# Patient Record
Sex: Female | Born: 1935 | Race: Black or African American | Hispanic: No
Health system: Southern US, Community
[De-identification: ages and names within clinical notes are randomized; demographics above are authoritative.]

---

## 2010-06-08 ENCOUNTER — Ambulatory Visit: Payer: Self-pay | Admitting: Internal Medicine

## 2010-06-09 ENCOUNTER — Other Ambulatory Visit: Payer: Self-pay | Admitting: Internal Medicine

## 2010-08-25 ENCOUNTER — Emergency Department: Payer: Self-pay | Admitting: Emergency Medicine

## 2010-09-03 ENCOUNTER — Inpatient Hospital Stay: Payer: Self-pay | Admitting: Specialist

## 2010-09-18 ENCOUNTER — Emergency Department: Payer: Self-pay | Admitting: Emergency Medicine

## 2010-09-20 ENCOUNTER — Ambulatory Visit: Payer: Self-pay | Admitting: Internal Medicine

## 2010-09-29 ENCOUNTER — Ambulatory Visit: Payer: Self-pay | Admitting: Emergency Medicine

## 2010-10-04 LAB — PATHOLOGY REPORT

## 2011-05-17 ENCOUNTER — Ambulatory Visit: Payer: Self-pay | Admitting: Internal Medicine

## 2011-06-14 ENCOUNTER — Emergency Department: Payer: Self-pay | Admitting: Emergency Medicine

## 2011-06-21 ENCOUNTER — Emergency Department: Payer: Self-pay | Admitting: Internal Medicine

## 2011-07-19 ENCOUNTER — Ambulatory Visit: Payer: Self-pay | Admitting: Gastroenterology

## 2011-07-20 LAB — PATHOLOGY REPORT

## 2011-08-08 ENCOUNTER — Ambulatory Visit: Payer: Self-pay | Admitting: Emergency Medicine

## 2011-08-11 ENCOUNTER — Inpatient Hospital Stay: Payer: Self-pay | Admitting: Emergency Medicine

## 2011-08-14 LAB — PATHOLOGY REPORT

## 2013-04-20 ENCOUNTER — Emergency Department: Payer: Self-pay | Admitting: Emergency Medicine

## 2013-04-20 LAB — URINALYSIS, COMPLETE
Bacteria: NONE SEEN
Bilirubin,UR: NEGATIVE
Blood: NEGATIVE
Ketone: NEGATIVE
Nitrite: NEGATIVE
Ph: 5 (ref 4.5–8.0)
Specific Gravity: 1.013 (ref 1.003–1.030)
Squamous Epithelial: 1
WBC UR: 2 /HPF (ref 0–5)

## 2013-04-20 LAB — COMPREHENSIVE METABOLIC PANEL
Albumin: 3.4 g/dL (ref 3.4–5.0)
Alkaline Phosphatase: 126 U/L (ref 50–136)
BUN: 19 mg/dL — ABNORMAL HIGH (ref 7–18)
Bilirubin,Total: 0.1 mg/dL — ABNORMAL LOW (ref 0.2–1.0)
Calcium, Total: 9 mg/dL (ref 8.5–10.1)
Co2: 28 mmol/L (ref 21–32)
Creatinine: 1.23 mg/dL (ref 0.60–1.30)
Glucose: 81 mg/dL (ref 65–99)
Osmolality: 283 (ref 275–301)
Potassium: 4.5 mmol/L (ref 3.5–5.1)
SGOT(AST): 20 U/L (ref 15–37)
SGPT (ALT): 18 U/L (ref 12–78)

## 2013-04-20 LAB — CBC
HCT: 30.1 % — ABNORMAL LOW (ref 35.0–47.0)
MCH: 29.5 pg (ref 26.0–34.0)
MCHC: 32.8 g/dL (ref 32.0–36.0)
MCV: 90 fL (ref 80–100)
Platelet: 270 10*3/uL (ref 150–440)
RBC: 3.35 10*6/uL — ABNORMAL LOW (ref 3.80–5.20)
RDW: 14.5 % (ref 11.5–14.5)

## 2013-04-20 LAB — CK TOTAL AND CKMB (NOT AT ARMC)
CK, Total: 133 U/L (ref 21–215)
CK-MB: 1 ng/mL (ref 0.5–3.6)

## 2013-04-20 LAB — TROPONIN I: Troponin-I: 0.06 ng/mL — ABNORMAL HIGH

## 2013-08-03 ENCOUNTER — Emergency Department: Payer: Self-pay | Admitting: Emergency Medicine

## 2013-08-03 LAB — COMPREHENSIVE METABOLIC PANEL
Anion Gap: 5 — ABNORMAL LOW (ref 7–16)
BUN: 25 mg/dL — ABNORMAL HIGH (ref 7–18)
Bilirubin,Total: 0.1 mg/dL — ABNORMAL LOW (ref 0.2–1.0)
Calcium, Total: 8.7 mg/dL (ref 8.5–10.1)
Creatinine: 1.48 mg/dL — ABNORMAL HIGH (ref 0.60–1.30)
EGFR (African American): 39 — ABNORMAL LOW
EGFR (Non-African Amer.): 34 — ABNORMAL LOW
Potassium: 4.3 mmol/L (ref 3.5–5.1)
SGPT (ALT): 26 U/L (ref 12–78)
Sodium: 139 mmol/L (ref 136–145)

## 2013-08-03 LAB — DRUG SCREEN, URINE
Barbiturates, Ur Screen: NEGATIVE (ref ?–200)
Benzodiazepine, Ur Scrn: NEGATIVE (ref ?–200)
Cannabinoid 50 Ng, Ur ~~LOC~~: NEGATIVE (ref ?–50)
MDMA (Ecstasy)Ur Screen: NEGATIVE (ref ?–500)
Opiate, Ur Screen: NEGATIVE (ref ?–300)
Phencyclidine (PCP) Ur S: NEGATIVE (ref ?–25)

## 2013-08-03 LAB — CBC
HCT: 32.3 % — ABNORMAL LOW (ref 35.0–47.0)
MCH: 32.7 pg (ref 26.0–34.0)
MCHC: 35.5 g/dL (ref 32.0–36.0)
Platelet: 253 10*3/uL (ref 150–440)
RDW: 14.2 % (ref 11.5–14.5)
WBC: 9.1 10*3/uL (ref 3.6–11.0)

## 2013-08-03 LAB — ETHANOL: Ethanol: <3 mg/dL

## 2013-08-03 LAB — URINALYSIS, COMPLETE
Bacteria: NONE SEEN
Bilirubin,UR: NEGATIVE
Blood: NEGATIVE
Glucose,UR: NEGATIVE mg/dL (ref 0–75)
Nitrite: NEGATIVE
Ph: 5 (ref 4.5–8.0)
Specific Gravity: 1.014 (ref 1.003–1.030)
Squamous Epithelial: 1
WBC UR: 17 /HPF (ref 0–5)

## 2013-08-03 LAB — ACETAMINOPHEN LEVEL: Acetaminophen: <2 ug/mL

## 2013-08-03 LAB — SALICYLATE LEVEL: Salicylates, Serum: <1.7 mg/dL

## 2013-12-17 ENCOUNTER — Emergency Department: Payer: Self-pay | Admitting: Emergency Medicine

## 2014-10-31 ENCOUNTER — Ambulatory Visit: Payer: Self-pay

## 2014-10-31 LAB — URINALYSIS, COMPLETE
BACTERIA: NONE SEEN
Bilirubin,UR: NEGATIVE
Blood: NEGATIVE
GLUCOSE, UR: NEGATIVE mg/dL (ref 0–75)
Ketone: NEGATIVE
Leukocyte Esterase: NEGATIVE
Nitrite: NEGATIVE
PH: 5 (ref 4.5–8.0)
Protein: NEGATIVE
RBC,UR: <1 /HPF (ref 0–5)
SQUAMOUS EPITHELIAL: NONE SEEN
Specific Gravity: 1.016 (ref 1.003–1.030)
WBC UR: 1 /HPF (ref 0–5)

## 2014-11-02 LAB — URINE CULTURE

## 2014-12-17 ENCOUNTER — Inpatient Hospital Stay: Payer: Self-pay | Admitting: Internal Medicine

## 2014-12-17 LAB — CBC WITH DIFFERENTIAL/PLATELET
BASOS PCT: 0.8 %
Basophil #: 0.1 10*3/uL (ref 0.0–0.1)
EOS PCT: 1.2 %
Eosinophil #: 0.1 10*3/uL (ref 0.0–0.7)
HCT: 38 % (ref 35.0–47.0)
HGB: 11.8 g/dL — AB (ref 12.0–16.0)
LYMPHS ABS: 3.1 10*3/uL (ref 1.0–3.6)
Lymphocyte %: 26.1 %
MCH: 30.7 pg (ref 26.0–34.0)
MCHC: 31 g/dL — ABNORMAL LOW (ref 32.0–36.0)
MCV: 99 fL (ref 80–100)
MONO ABS: 0.8 x10 3/mm (ref 0.2–0.9)
Monocyte %: 6.6 %
NEUTROS ABS: 7.8 10*3/uL — AB (ref 1.4–6.5)
NEUTROS PCT: 65.3 %
Platelet: 373 10*3/uL (ref 150–440)
RBC: 3.84 10*6/uL (ref 3.80–5.20)
RDW: 14.5 % (ref 11.5–14.5)
WBC: 12 10*3/uL — ABNORMAL HIGH (ref 3.6–11.0)

## 2014-12-17 LAB — URINALYSIS, COMPLETE
Bilirubin,UR: NEGATIVE
Glucose,UR: NEGATIVE mg/dL (ref 0–75)
KETONE: NEGATIVE
NITRITE: NEGATIVE
Ph: 5 (ref 4.5–8.0)
Protein: NEGATIVE
RBC,UR: 15 /HPF (ref 0–5)
SPECIFIC GRAVITY: 1.017 (ref 1.003–1.030)
WBC UR: 57 /HPF (ref 0–5)

## 2014-12-17 LAB — PROTIME-INR
INR: 1.1
Prothrombin Time: 14.3 secs (ref 11.5–14.7)

## 2014-12-17 LAB — COMPREHENSIVE METABOLIC PANEL
ALBUMIN: 3.2 g/dL — AB (ref 3.4–5.0)
Alkaline Phosphatase: 101 U/L
Anion Gap: 13 (ref 7–16)
BUN: 69 mg/dL — AB (ref 7–18)
Bilirubin,Total: 0.2 mg/dL (ref 0.2–1.0)
Calcium, Total: 9.5 mg/dL (ref 8.5–10.1)
Chloride: 112 mmol/L — ABNORMAL HIGH (ref 98–107)
Co2: 20 mmol/L — ABNORMAL LOW (ref 21–32)
Creatinine: 2.65 mg/dL — ABNORMAL HIGH (ref 0.60–1.30)
GFR CALC AF AMER: 22 — AB
GFR CALC NON AF AMER: 19 — AB
GLUCOSE: 107 mg/dL — AB (ref 65–99)
Osmolality: 309 (ref 275–301)
Potassium: 6 mmol/L — ABNORMAL HIGH (ref 3.5–5.1)
SGOT(AST): 45 U/L — ABNORMAL HIGH (ref 15–37)
SGPT (ALT): 43 U/L
Sodium: 145 mmol/L (ref 136–145)
TOTAL PROTEIN: 8.4 g/dL — AB (ref 6.4–8.2)

## 2014-12-17 LAB — TROPONIN I: Troponin-I: 0.05 ng/mL

## 2014-12-17 LAB — LIPASE, BLOOD: Lipase: 324 U/L (ref 73–393)

## 2014-12-17 LAB — APTT: Activated PTT: 28.1 secs (ref 23.6–35.9)

## 2014-12-17 LAB — POTASSIUM: Potassium: 4.9 mmol/L (ref 3.5–5.1)

## 2014-12-18 LAB — CBC WITH DIFFERENTIAL/PLATELET
BASOS ABS: 0.1 10*3/uL (ref 0.0–0.1)
Basophil %: 0.7 %
EOS PCT: 1.8 %
Eosinophil #: 0.2 10*3/uL (ref 0.0–0.7)
HCT: 31.9 % — ABNORMAL LOW (ref 35.0–47.0)
HGB: 10.2 g/dL — ABNORMAL LOW (ref 12.0–16.0)
Lymphocyte #: 3.3 10*3/uL (ref 1.0–3.6)
Lymphocyte %: 32.1 %
MCH: 30.9 pg (ref 26.0–34.0)
MCHC: 32 g/dL (ref 32.0–36.0)
MCV: 97 fL (ref 80–100)
MONO ABS: 0.6 x10 3/mm (ref 0.2–0.9)
Monocyte %: 5.9 %
Neutrophil #: 6.1 10*3/uL (ref 1.4–6.5)
Neutrophil %: 59.5 %
Platelet: 313 10*3/uL (ref 150–440)
RBC: 3.3 10*6/uL — AB (ref 3.80–5.20)
RDW: 13.9 % (ref 11.5–14.5)
WBC: 10.2 10*3/uL (ref 3.6–11.0)

## 2014-12-18 LAB — BASIC METABOLIC PANEL
Anion Gap: 7 (ref 7–16)
BUN: 55 mg/dL — AB (ref 7–18)
CHLORIDE: 116 mmol/L — AB (ref 98–107)
CREATININE: 1.96 mg/dL — AB (ref 0.60–1.30)
Calcium, Total: 9.4 mg/dL (ref 8.5–10.1)
Co2: 23 mmol/L (ref 21–32)
EGFR (African American): 32 — ABNORMAL LOW
GFR CALC NON AF AMER: 26 — AB
GLUCOSE: 107 mg/dL — AB (ref 65–99)
OSMOLALITY: 306 (ref 275–301)
POTASSIUM: 5 mmol/L (ref 3.5–5.1)
Sodium: 146 mmol/L — ABNORMAL HIGH (ref 136–145)

## 2014-12-19 LAB — BASIC METABOLIC PANEL
Anion Gap: 7 (ref 7–16)
BUN: 29 mg/dL — AB (ref 7–18)
CHLORIDE: 111 mmol/L — AB (ref 98–107)
CO2: 22 mmol/L (ref 21–32)
Calcium, Total: 8.8 mg/dL (ref 8.5–10.1)
Creatinine: 1.39 mg/dL — ABNORMAL HIGH (ref 0.60–1.30)
EGFR (African American): 47 — ABNORMAL LOW
EGFR (Non-African Amer.): 39 — ABNORMAL LOW
Glucose: 85 mg/dL (ref 65–99)
Osmolality: 284 (ref 275–301)
Potassium: 4.9 mmol/L (ref 3.5–5.1)
SODIUM: 140 mmol/L (ref 136–145)

## 2014-12-19 LAB — CLOSTRIDIUM DIFFICILE(ARMC)

## 2014-12-20 LAB — URINE CULTURE

## 2014-12-21 LAB — STOOL CULTURE

## 2014-12-28 ENCOUNTER — Ambulatory Visit: Payer: Self-pay | Admitting: Internal Medicine

## 2015-01-10 ENCOUNTER — Inpatient Hospital Stay: Payer: Self-pay | Admitting: Internal Medicine

## 2015-01-26 ENCOUNTER — Ambulatory Visit
Admit: 2015-01-26 | Disposition: A | Discharge status: Home / Self Care | Payer: Self-pay | Attending: Internal Medicine | Admitting: Internal Medicine

## 2015-02-26 DEATH — deceased

## 2015-03-28 NOTE — Consult Note (Signed)
Chief Complaint:  Subjective/Chief Complaint seen for diarrhea and abdominalpain.  Patient more alert today, more responsive though tangential.   denies nausea or abdominal pain.   VITAL SIGNS/ANCILLARY NOTES: **Vital Signs.:   18-Feb-16 16:37  Vital Signs Type Q 4hr  Temperature Temperature (F) 98.9  Celsius 37.1  Respirations Respirations 18  Systolic BP Systolic BP 001  Diastolic BP (mmHg) Diastolic BP (mmHg) 97  Mean BP 110  Pulse Ox % Pulse Ox % 98  Pulse Ox Activity Level  At rest  Oxygen Delivery Room Air/ 21 %   Brief Assessment:  Cardiac Regular   Respiratory clear BS   Gastrointestinal details normal Nondistended  No rebound tenderness  bowel sounds positive, no apparent tenderness to palpation.  however mild voluntary guarding?   Lab Results:  Routine Micro:  17-Feb-16 19:55   Culture Comment    . POSITIVE CAMPYLOBACTER AG (JEJUNI/COLI)  Routine Chem:  17-Feb-16 19:55   Result Comment CAMPYLOBACTER ANTIGEN - READ-BACK PROCESS PERFORMED.  - ABBIE GOODMAN 01/14/15 @ 1025.Marland KitchenMarland KitchenMTV  Result(s) reported on 14 Jan 2015 at 10:31AM.  18-Feb-16 10:19   Glucose, Serum  137  BUN  47  Creatinine (comp)  2.85  Sodium, Serum 138  Potassium, Serum  3.2  Chloride, Serum 102  CO2, Serum 27  Calcium (Total), Serum  8.4  Anion Gap 9  Osmolality (calc) 290  eGFR (African American)  21  eGFR (Non-African American)  17 (eGFR values <68m/min/1.73 m2 may be an indication of chronic kidney disease (CKD). Calculated eGFR, using the MRDR Study equation, is useful in  patients with stable renal function. The eGFR calculation will not be reliable in acutely ill patients when serum creatinine is changing rapidly. It is not useful in patients on dialysis. The eGFR calculation may not be applicable to patients at the low and high extremes of body sizes, pregnant women, and vegetarians.)   Radiology Results: XRay:    17-Feb-16 17:57, Abdomen Flat and Erect  Abdomen Flat and Erect    REASON FOR EXAM:    evidence of possible sbo v. enteritis  COMMENTS:       PROCEDURE: DXR - DXR ABDOMEN 2 V FLAT AND ERECT  - Jan 13 2015  5:57PM     CLINICAL DATA:  Abnormal CT demonstrating small bowel distention  question enteritis versus small bowel obstruction, followup    EXAM:  ABDOMEN - 2 VIEW    COMPARISON:  CT abdomen pelvis 01/10/2015    FINDINGS:  Distended loops of small bowel are seen in the mid abdomen and  pelvis.    Persistent visualization of gas within the stomach though the  stomach appears less distended than on the prior CT.    No definite bowel wall thickening or free intraperitoneal air.    Surgical clips RIGHT upper quadrant from prior cholecystectomy.    No significant colonic gas.    Probable rectal tube.    Extensive atherosclerotic calcifications.  Osseous demineralization with scoliosis and degenerative changes of  the lumbar spine.    Numerous calcified granulomata at lower LEFT lung.    Lung bases otherwise grossly clear.     IMPRESSION:  Persistent dilatation of small bowel loops with paucity of colonic  gas compatible with small bowel obstruction.    No definite bowel wall thickening or free intraperitoneal air.    Extensive atherosclerotic disease changes.  Electronically Signed    By: MLavonia DanaM.D.    On: 01/13/2015 18:11  Verified By: Burnetta Sabin, M.D.,   Assessment/Plan:  Assessment/Plan:  Assessment 1) diarrhea, abdominal pain-imaging showing possible enteritis/SBO/ileus.  Culture today showing positive for campylobacter.  Patient currently on zosyn, vancomycin and previously on augemntin for admission dx of sepsis.   Campylobacter is resistant/not responsive to beta-lactams or pcn, and likely not vancomycin.  recommend starting macrolide, azithromycin, some recommendations are for several weeks of treatment. Daily abdominal film.   Plan as above.   Electronic Signatures: Loistine Simas (MD)  (Signed  18-Feb-16 17:43)  Authored: Chief Complaint, VITAL SIGNS/ANCILLARY NOTES, Brief Assessment, Lab Results, Radiology Results, Assessment/Plan   Last Updated: 18-Feb-16 17:43 by Loistine Simas (MD)

## 2015-03-28 NOTE — Consult Note (Signed)
Brief Consult Note: Diagnosis: sepsis.   Patient was seen by consultant.   Consult note dictated.   Comments: Appreciate consult to 81 y/o african [place] woman with history of severe dementia (resides [facility]), CRF, htn, aocd, recent UTI, admitted with sepsis, ARF, UTI/enteritis, for evaluation of abdominal pain and diarrhea. On admission patient was found to be c-diff negative. Had CT demonstrating some ileitis and enteritis to small bowel. Urine had enterobacter growing. Was on amoxicillin prior to admission - on adm was treated with vancomycin, zosyn, and IVF. Currently on Augmentin. Has been evaluated by nephrology and palliative care- pt not hospice approp now. Patient unable to give history due to severe dementia. No family at bedside. Hx gathered from chart and speaking to nurse. Has had diarrhea through hospital course- stools watery green : no black or bloody stools. Has had some possible abdominal pain. Appetite decreased- has had speech evaluation and is on pureed diet. No other abdominal issues. Labs: ANA/ANCA normal, no m-spike on SPEP. K+ normal. Albumin low at 3.0. WBC 18, hgb 8.7- this is improved from the last couple of checks. Platelets normal.  Cspy 2012 by Dr Niel HummerIftikhar with 20mm polyup removed. EGD 2012 gastritis/normal duodenum. EGD 2011 found duodenal ulcers. On exam, abdomen protuberant, soft with some RLQ tenderness. Has rectal tube in place with green watery diarrhea. Impression/plan: Diarrhea. Intermittent abdominal pain. Recent findings of enteritis/ileitis v. partial sbo on CT scan. There has been no NV. Has had multiple antibiotics, and is currently on Augmentin. Repeat c-diff; 2 view abdomen. Further recommendations to follow.  Electronic Signatures: Vevelyn PatLondon, Natalyn Szymanowski H (NP)  (Signed 17-Feb-16 16:07)  Authored: Brief Consult Note   Last Updated: 17-Feb-16 16:07 by Keturah BarreLondon, Caya Soberanis H (NP)

## 2015-03-28 NOTE — H&P (Signed)
PATIENT NAME:  Melissa Roberts, Melissa Roberts MR#:  [id number] DATE OF BIRTH:  07-25-1934  DATE OF ADMISSION:  01/10/2015  PRIMARY CARE PHYSICIAN: Corky Downs, MD   CHIEF COMPLAINT: Abdominal pain and hypotension.   HISTORY OF PRESENT ILLNESS: This is a 81 year old female, who was brought in from  [facility] skilled nursing facility due to abdominal pain and hypotension. The patient was recently hospitalized for similar complaints about 2 weeks ago and diagnosed with some enteritis and urinary tract infection, and discharged on oral amoxicillin. She now returns due to similar symptoms of vague abdominal pain, having some diarrhea and poor p.o. intake. She was noted to be severely hypothermic when she arrived to the ER with a temperature of 89.6. She was also noted to be hypotensive and the systolic blood pressures in the 70s, also bradycardic. Her blood work was consistent with severe acute renal failure. She was diagnosed with septic shock secondary to a possible enteritis. Hospitalist services were contacted for further treatment and evaluation. Given the patient's advanced dementia, most of the history obtained from the family and also from the chart and ER physician.   REVIEW OF SYSTEMS: Otherwise unobtainable.   PAST MEDICAL HISTORY: Consistent with severe Alzheimer dementia, hypertension, chronic anemia, chronic kidney disease stage III, history of diarrhea.   ALLERGIES: CODEINE.   SOCIAL HISTORY: No smoking or alcohol abuse. No illicit drug abuse. Resides at [facility].   FAMILY HISTORY: Mother and father both died from old age.   CURRENT MEDICATIONS: As follows: Tylenol 650 every 4 hours as needed, Aricept 5 mg daily, Desitin 13% topical to be applied to the buttocks, Drisdol 50,000 international units monthly, loperamide 2 mg daily as needed, Proctosol HC 2.5% topical cream to the hemorrhoids as needed.   PHYSICAL EXAMINATION: Presently is as follows:  VITAL SIGNS: Temperature is 89.2,  pulse 70, respirations are 14, blood pressure is 90/60, sats 100% on room air.  GENERAL: She is a critically ill, lethargic-appearing female, but in no apparent distress.  HEAD, EYES, EARS, NOSE, THROAT: Atraumatic, normocephalic. Extraocular muscles are intact. Pupils equal and reactive to light. Sclerae anicteric. No conjunctival injection. No pharyngeal erythema. Dry oral mucosa.  NECK: Supple. There is no jugular venous distention. No bruits. No lymphadenopathy or thyromegaly.  HEART: Regular rate and rhythm. No murmurs, no rubs, no clicks.  LUNGS: Clear to auscultation anteriorly. No rales or rhonchi. No wheezes.  ABDOMEN: Soft, flat, nontender, nondistended. Has good bowel sounds. No hepatosplenomegaly appreciated.  EXTREMITIES: No evidence of any cyanosis, clubbing, or peripheral edema. Has +2 pedal and radial pulses bilaterally.  NEUROLOGIC: The patient is quite encephalopathic. Globally weak, difficult to do a full neurologic exam. Moves all extremities spontaneously.  SKIN: Moist and warm with no rashes.  LYMPHATIC: There is no cervical or axillary lymphadenopathy.   LABORATORY EXAMINATION: She had a serum glucose of 198, BUN 110, creatinine 8.2, sodium 147, potassium 4.4, chloride 115, bicarbonate 8, anion gap at 24. LFTs are mildly elevated with an AST of 38, ALT 70, albumin 3.0, troponin 0.05. White cell count 17.8, hemoglobin 11.9, hematocrit 39.1, platelet count 37, INR is 1.3, lactic acid of 11.5. The patient did have a chest x-ray done, which showed no acute cardiopulmonary process. The patient also had a CT scan of the abdomen and pelvis done, showing mildly distended loops of small bowel and stomach with gas and fluid with relative area of narrowing at the terminal ileum. No obstructing cause identified. Questionable partial bowel obstruction versus  ileus enteritis. No evidence of pneumoperitoneum. The patient also had a CT head done, which showed no acute intracranial process.    ASSESSMENT AND PLAN: This is a 81 year old female with a history of advanced dementia, chronic anemia, history of diarrhea, hypertension, chronic kidney disease stage III, who presents to the hospital due to abdominal pain, hypotension and noted to be in acute renal failure with septic shock.   1. Septic shock. This is likely secondary to enteritis. The patient's CT shows ileus/enteritis. The patient was also apparently having some diarrhea prior to coming in. Her chest x-ray is negative for pneumonia. Her urinalysis is still pending. Will continue aggressive IV fluids, give her broad-spectrum intravenous antibiotics, vancomycin and Zosyn check stool for Clostridium difficile and comprehensive culture. Follow sputum and blood cultures and urine cultures. Give her dopamine, keep mean arterial pressure greater than 60.  2. Altered mental status. This is likely metabolic encephalopathy from the septic shock. The patient's CT head is negative for any acute intracranial abnormality. Follow mental status after treatment for sepsis and underlying infection.  3. Acute renal failure. This is likely acute tubular necrosis from severe hypotension, sepsis and also severe dehydration. Continue aggressive intravenous fluid hydration. Continue vasopressors. Keep MAP greater than 60. Follow BUN and creatinine and urine output, renal dose medications, avoid nephrotoxins. We will get a nephrology consult. There is no urgent need for hemodialysis and the patient's family does not want hemodialysis at this time.  4. Leukocytosis. This is likely secondary to the sepsis. We will follow white cell count with IV antibiotic therapy.  5. Lactic acidosis. This is likely secondary to septic shock and dehydration. We will continue supportive care with intravenous fluids, intravenous antibiotics and vasopressors and follow clinically.   The patient is high risk for cardiorespiratory arrest. The patient is already DO NOT RESUSCITATE.  We will get a palliative care consult to discuss goals of care with the family.   CRITICAL CARE TIME SPENT: 50 minutes.   ____________________________ Rolly PancakeVivek J. Cherlynn KaiserSainani, MD vjs:ap D: 01/10/2015 15:01:26 ET T: 01/10/2015 15:46:37 ET JOB#: [id number]  cc: Rolly PancakeVivek J. Cherlynn KaiserSainani, MD, <Dictator> Houston SirenVIVEK J SAINANI MD ELECTRONICALLY SIGNED 01/27/2015 15:40

## 2015-03-28 NOTE — Consult Note (Signed)
   Comments   Goals of care meeting had with pt's two daughters, Lucendia HerrlichFaye and Dewayne Hatchnn.  Family states Hospice services were mentioned in the ER and this is how Palliative Medicine was consulted.  [facility]  Pt at baseline can walk on her own, eats 50-75% of meals, can participate in feeding self and cannot bathe or dress herself.  Pt is incontinent of bladder and bowel.  Family states they do not think pt is hospice appropriate at this time and I agree.  Family would like to continue current treatment with the hopes of pt recovering from her infection.  Family agrees that pt will be hospice appropriate one day due to her Dementia diagnosis and would accept services at that time.  [facility]  Electronic Signatures: Aslyn Cottman, Jaquelyn BitterStephen J (NP)  (Signed 15-Feb-16 12:34)  Authored: Palliative Care Phifer, Harriett SineNancy (MD)  (Signed 15-Feb-16 15:29)  Authored: Palliative Care   Last Updated: 15-Feb-16 15:29 by Phifer, Harriett SineNancy (MD)

## 2015-03-28 NOTE — Consult Note (Signed)
PATIENT NAME:  Melissa Roberts, Melissa Roberts MR#:  [id number] DATE OF BIRTH:  Jun 16, 1934  DATE OF CONSULTATION:  01/13/2015  REFERRING PHYSICIAN:   CONSULTING PHYSICIAN:  Keturah Barrehristiane H. Wei Newbrough, NP  REASON FOR CONSULTATION: GI consult ordered by Dr. Allena KatzPatel for evaluation of diarrhea and abdominal pain.   HISTORY OF PRESENT ILLNESS: I appreciate consult for this 81 year old [place] woman with a history of severe dementia who [facility] [place], hypertension, anemia of chronic disease, recent UTI, admitted with sepsis, ARF, URI/enteritis for evaluation of abdominal pain and diarrhea. On admission, the patient was found to be Clostridium difficile negative. Head CT demonstrated some ileitis and enteritis versus partial small bowel obstruction. Urine had Enterobacter growing. Was on amoxicillin prior to admission. On admission was treated with vancomycin, Zosyn, and IV fluids, currently Augmentin. Has been evaluated by nephrology and palliative care. The patient hospice appropriate now. The patient is unable to give history due to severe dementia. No family at bedside. History is gathered from chart and speaking to nurse. Has had diarrhea throughout the hospital course. Stools have been watery, green. No black or bloody stools, has had some possible abdominal pain. Appetite has been decreased. Had speech evaluation, is on pureed diet now at this time. There have been no other abdominal issues. Laboratory studies include a normal ANA/ANCA. She had an SPEP without any end spike. Potassium normal. Albumin low at 3.0. WBC 18, hemoglobin 8.7, this has improved from the last couple of checks. Her platelets are normal. Colonoscopy 2012 by Dr. Darcel BayleyIftikha with 20 mm polyp removed. EGD 2012 with gastritis and normal duodenum. EGD 2011 found duodenal ulcers.   REVIEW OF SYSTEMS: Unobtainable due to the patient's mental status.   PAST MEDICAL HISTORY: Severe Alzheimer, hypertension, chronic anemia, chronic kidney  disease stage III, history of diarrhea.   ALLERGIES: CODEINE.   SOCIAL HISTORY: No alcohol or illicits. [facility]   FAMILY HISTORY: Unknown.   CURRENT MEDICATIONS: Acetaminophen 325 tablets, 2 tablets every 4 hours p.r.n., Zyrtec 5 mg once a day, Desitin cream, Drisdol vitamin D once a week 50,000 units, Imodium 1 tablet once a day p.r.n., Proctosol topical cream daily x 14 days.   MOST RECENT LABORATORIES: Glucose 114, BUN 56, creatinine 3.06, sodium 144, potassium 3.7, chloride 109, GFR about 16, calcium 7.9, lipase 165, magnesium 2.2, total protein 8, albumin 3, total bilirubin 0.2, ALP 130, AST 38, ALT 70. WBC 18, hemoglobin 8.7, hematocrit 27.2, platelet count 204. Blood cultures negative. Urine culture demonstrating enterococcus. Clostridium difficile test negative  02/14. CT scan 02/14 with ileitis/enteritis versus partial small bowel obstruction. Gas and fluid in the colon. No definite obstruction. ANCA negative. ANA negative. No Roberts-spike on SPEP.    PHYSICAL EXAMINATION: MOST RECENT VITAL SIGNS: Temperature 98.5, pulse 80, respiratory rate 18, blood pressure 116/79, SaO2 99% on room air.  GENERAL: A well-appearing, elderly lady in bed in no acute distress.  PSYCHIATRIC: Limited means of communication. Responds to some questions with limited statements. Otherwise, does not answer some, follows some commands.  HEENT: Normocephalic, atraumatic. Conjunctivae pink. Sclerae are clear.  NECK: Supple. No JVD or lymphadenopathy.  CHEST: Respirations unlabored, eupneic.  LUNGS: With minimally decreased bilateral bases.  ABDOMEN: Protuberant abdomen. Hypoactive bowel sounds x 4. There is some right lower quadrant tenderness. No guarding, rigidity, peritoneal signs, hepatosplenomegaly or other masses. She does have a rectal tube draining green watery diarrhea.  SKIN: Warm, dry, pink. No erythema, lesion or rash.  EXTREMITIES: Strength  4/5. Has some general weakness. No clubbing or  cyanosis.  NEUROLOGIC: Alert, difficult to assess orientation. Cranial nerves appear to be intact.   IMPRESSION AND PLAN: Diarrhea and intermittent abdominal pain. Recent findings of enteritis/ileitis versus partial small bowel obstruction on CT scan. There has been no nausea and vomiting. Has had multiple antibiotics and is currently on Augmentin. Repeat Clostridium difficile, 2 view abdomen. Further recommendations to follow. Thank you very much for this consult.   These services were provided by Vevelyn Pat, MSN, [facility], in collaboration with Barnetta Chapel, MD, with whom I have discussed this patient in full.     ____________________________ Keturah Barre, NP chl:at D: 01/14/2015 09:04:57 ET T: 01/14/2015 09:19:55 ET JOB#: [id number]  cc: Keturah Barre, NP, <Dictator> Eustaquio Maize Ethaniel Garfield FNP ELECTRONICALLY SIGNED 01/14/2015 17:06

## 2015-03-28 NOTE — Consult Note (Signed)
Chief Complaint:  Subjective/Chief Complaint Please see full GI consult and brief consult note.   Patietn seen and examined.  Patient admitted with sepsis.  Currently with continued diarrhea and abdominal pain.  On examination patient relates pain when asked, but minimal discomfort to palpation, no evidence of acute abdomen, non-distended,  Increasing wbc noted today.  No stool cultures done on admission, will obtain now, although may not be informative due to interval abx.  Abd plain films to follow possible ileus.  Following.   Electronic Signatures: Barnetta ChapelSkulskie, Martin (MD)  (Signed 17-Feb-16 18:55)  Authored: Chief Complaint   Last Updated: 17-Feb-16 18:55 by Barnetta ChapelSkulskie, Martin (MD)

## 2015-03-28 NOTE — Consult Note (Signed)
PATIENT NAME:  Melissa Roberts, Melissa Roberts MR#:  [id number] DATE OF BIRTH:  1934/07/15  DATE OF CONSULTATION:  01/11/2015  REQUESTING PHYSICIAN:   Abel Presto, MD   CONSULTING PHYSICIAN:  Munsoor Lilian Kapur, MD   REASON FOR CONSULTATION: Acute renal failure.   HISTORY OF PRESENT ILLNESS: The patient is a 81 year old African American female with past medical history of Alzheimer dementia, hypertension, chronic anemia, chronic kidney disease Stage III, who presented to [facility] with abdominal pain and hypotension.  The patient was here at [facility] approximately 2 weeks ago and had enteritis as well as urinary tract infection. She was discharged on amoxicillin. She came back with abdominal pain and poor p.o. intake.   She was found to be hypothermic upon presentation as well.  She was also hypotensive with systolic blood pressure in the 70s. When she presented, she was found to have severe acute renal failure with a creatinine of 8.27. She was also hypernatremic with a serum sodium of 147. She was found to be quite acidotic with a serum bicarbonate of 8.  Lactic acid was also initially elevated at 11.5.  She is on a bicarbonate drip and serum bicarbonate is now up to 16.  The patient is unable to offer any history at this point in time.   PAST MEDICAL HISTORY:  1.  Alzheimer dementia.  2.  Hypertension.  3.  Anemia.  4.  Chronic kidney disease Stage III with baseline creatinine 1.23 with an EGFR of 49.   ALLERGIES: CODEINE.   CURRENT INPATIENT MEDICATIONS: Include:  1.  Norepinephrine drip.  2.  Normal saline 125 mL per hour. 3.  Sodium bicarbonate drip 150 mEq at 150 mL per hour.  4.  Tylenol 650 mg p.o. every 4 hours p.r.n.  5.  Morphine 1 to 2 mg IV every 4 hours p.r.n.  6.  Zofran 4 mg IV every 4 hours p.r.n.  7.  Zosyn 3.375 grams IV every 12 hours.   SOCIAL HISTORY: Unable to obtain directly from the patient; however, no reported tobacco, alcohol, or  illicit drug use. The patient resides at [facility].   FAMILY HISTORY: Unable to obtain from the patient, given her dementia.   REVIEW OF SYSTEMS:  Unable to obtain from the patient given her underlying dementia.   PHYSICAL EXAMINATION:  VITAL SIGNS: Temperature 98, pulse 94, respirations 14, blood pressure 96/53, pulse oximetry 100% on 3 liters.  GENERAL: Reveals a critically ill-appearing African American female.  HEENT: Normocephalic, atraumatic. Difficult to assess extraocular movements. Pupils were 3 mm and sluggish to react. Oral mucosa noted to be dry, difficult to assess hearing.  NECK: Supple without JVD or lymphadenopathy.  LUNGS: Clear to auscultation bilaterally with normal respiratory effort.  CARDIOVASCULAR: S1, S2 regular rate and rhythm. No murmurs, rubs, or gallops appreciated.  ABDOMEN: Soft. There is mild diffuse tenderness. No rebound or guarding, however.  EXTREMITIES: No clubbing, cyanosis, or edema.  NEUROLOGIC: The patient is arousable and will follow a few simple commands but not consistently.  GENITOURINARY: No suprapubic tenderness is noted at this time. Foley catheter noted to be in place.  SKIN: Warm and dry. Decreased skin turgor noted. No rashes noted.  MUSCULOSKELETAL: No joint redness, swelling or tenderness appreciated.  PSYCHIATRIC: Unable to assess at this time.   LABORATORY DATA: Sodium 150, potassium 4.1, chloride 120,  CO2 of 16, BUN 84, creatinine 5.32, glucose 178, total protein eight, albumin 3.0, total bilirubin 0.2, alkaline phosphatase  130, AST 38, ALT 70. Troponin 0.05. CBC shows WBC is 10.5, hemoglobin 9.3, hematocrit 28, platelets 215,000.  INR 1.3. Blood cultures x 2 sets are negative.  C. difficile toxin negative. Urinalysis shows 10 RBCs per high-power field, 12 WBCs per high-power field. ABG shows a pH of 7.25, pCO2 of 27, pO2 of 73.   IMPRESSION:  This is a 81 year old African American female with a past medical history of Alzheimer  dementia, hypertension, anemia, chronic kidney disease Stage III, history of diarrhea who presented to [facility] with abdominal pain, hypotension, acute renal failure.   PROBLEM LIST:  1.  Acute renal failure secondary to acute tubular necrosis.  2.  Chronic kidney disease, Stage III.  3.  Metabolic acidosis.  4.  Hypernatremia.  6.  Anemia of chronic kidney disease.   PLAN: The patient presents with a very severe acute renal failure. She also has additional associated metabolic derangements. We will proceed with renal ultrasound to exclude obstruction at this time. Continue pressors to maintain MAP of 65. We will continue the patient on hydration.  However, discontinue normal saline and current bicarbonate drip. We will place the patient on sodium bicarbonate drip with 50 mEq and we will give this at a rate of 150 mL per hour.  Continue to monitor electrolytes closely.  No urgent indication for dialysis at present as renal parameters have improved with hydration.  However, overall prognosis is guarded at this time.   I would like to thank Dr. Verdell Carmine for this kind referral.      ____________________________ Tama High, MD mnl:DT D: 01/11/2015 09:07:51 ET T: 01/11/2015 09:44:56 ET JOB#: [id number]  cc: Tama High, MD, <Dictator> Mariah Milling LATEEF MD ELECTRONICALLY SIGNED 01/24/2015 11:43

## 2015-03-28 NOTE — Consult Note (Signed)
Chief Complaint:  Subjective/Chief Complaint Covering for Dr. Marva PandaSkulskie. Confused. Unable to obtain much history. Still with diarrhea per rectal tube. Worsening renal failure.   VITAL SIGNS/ANCILLARY NOTES: **Vital Signs.:   21-Feb-16 07:36  Vital Signs Type Q 4hr  Temperature Temperature (F) 98.4  Celsius 36.8  Temperature Source oral  Pulse Pulse 72  Respirations Respirations 18  Systolic BP Systolic BP 100  Diastolic BP (mmHg) Diastolic BP (mmHg) 65  Mean BP 76  Pulse Ox % Pulse Ox % 98  Pulse Ox Activity Level  At rest  Oxygen Delivery Room Air/ 21 %   Brief Assessment:  GEN ill appearing   Cardiac Regular   Respiratory clear BS   Gastrointestinal Normal   Lab Results: Routine Chem:  21-Feb-16 08:54   Result Comment LABS - This specimen was collected through an   - indwelling catheter or arterial line.  - A minimum of 5mls of blood was wasted prior    - to collecting the sample.  Interpret  - results with caution.  Result(s) reported on 17 Jan 2015 at 09:27AM.  Phosphorus, Serum  5.4  Routine Hem:  21-Feb-16 08:54   WBC (CBC)  11.1  RBC (CBC)  2.87  Hemoglobin (CBC)  8.6  Hematocrit (CBC)  27.0  Platelet Count (CBC) 270  MCV 94  MCH 30.0  MCHC  31.8  RDW 13.6  Neutrophil % 73.5  Lymphocyte % 15.2  Monocyte % 9.6  Eosinophil % 1.4  Basophil % 0.3  Neutrophil #  8.1  Lymphocyte # 1.7  Monocyte #  1.1  Eosinophil # 0.2  Basophil # 0.0   Assessment/Plan:  Assessment/Plan:  Assessment Campylobacter sepsis. On zithromax. ATN   Plan Continue to follow renal recommendations. Continue Abx. Hopefully, diarrhea will gradually improve. Dr. Marva PandaSkulskie to resume care tomorrow. Thanks.   Electronic Signatures: Lutricia Feilh, Emmalynn Pinkham (MD)  (Signed 21-Feb-16 10:09)  Authored: Chief Complaint, VITAL SIGNS/ANCILLARY NOTES, Brief Assessment, Lab Results, Assessment/Plan   Last Updated: 21-Feb-16 10:09 by Lutricia Feilh, Luan Urbani (MD)

## 2015-03-28 NOTE — Discharge Summary (Signed)
PATIENT NAME:  Melissa Roberts, Melissa Roberts MR#:  [id number] DATE OF BIRTH:  02-17-1934  DATE OF ADMISSION:  01/10/2015 DATE OF DISCHARGE:  01/23/2015  ADDENDUM  There has been an interim discharge summary by Dr. Luberta MutterKonidena on 01/22/2015. This is an addendum to that interim discharge summary.   The patient was discharged to hospice home today. There have been no changes in her status since the interim discharge summary dictated 1 day prior to this discharge.   DISCHARGE DIAGNOSES:  1.  Campylobacter enteritis.  2. Altered mental status due to metabolic encephalopathy.  3. Acute renal failure on chronic kidney disease stage III.  4. Leukocytosis.  5. Advanced dementia per further.   For further details of hospital course, please see the interim discharge summary dictated by Dr. Luberta MutterKonidena on 01/22/2015. There are no changes to her status. No additional laboratories orders discharged to hospice home.   TIME SPENT ON DISCHARGE: 20 minutes.   ____________________________ Ena Dawleyatherine P. Clent RidgesWalsh, MD cpw:ap D: 01/25/2015 20:45:26 ET T: 01/26/2015 08:41:45 ET JOB#: [id number]  cc: Santina Evansatherine P. Clent RidgesWalsh, MD, <Dictator> Gale JourneyATHERINE P WALSH MD ELECTRONICALLY SIGNED 02/01/2015 10:44

## 2015-03-28 NOTE — Discharge Summary (Signed)
PATIENT NAME:  Melissa Roberts, Melissa Roberts MR#:  [id number] DATE OF BIRTH:  1934/05/16  DATE OF ADMISSION:  12/17/2014 DATE OF DISCHARGE:  12/20/2014  DISCHARGE DIAGNOSES: 1. Viral enteritis. Clostridium difficile and stool culture negative. CT scan confirms enteritis.  2. Acute renal failure, likely dehydration.  3. Urinary tract infection 40,000 colony forming units of Enterococcus faecalis sensitive to ampicillin and resistant to Cipro.  4. Hypertension.   CONDITION ON DISCHARGE: Stable.   MEDICATIONS ON DISCHARGE:  1. Lorazepam 0.5 mg oral take 1/2 tablet every 12 hours as needed for agitation.  2. Lisinopril 20 mg oral once a day.  3. Cetirizine 5 mg once a day.  4. Loperamide 2 mg once a day as needed for diarrhea.  5. Amoxicillin 875 mg oral every 12 hours for 3 days.   DIET: Low sodium and supplement Ensure twice a day, regular consistency diet.  FOLLOW UP APPOINTMENTS: Advised to follow with PMD in 1 to 2 weeks,   HISTORY OF PRESENTING ILLNESS: A 81 year old female [facility], with renal pain and some diarrhea, had been complaining of her abdomen hurting for the past few days, she also had on-and-off diarrhea for the past few days, who presented to the hospital as her symptoms were not improving. She was noted to be in acute renal failure also, and mild hyperkalemia. CT scan of her abdomen and pelvis was consistent with enteritis. Hospitalist service was contacted for further treatment and evaluation.   HOSPITAL COURSE AND STAY:  1. Acute on chronic renal failure secondary to dehydration from her gastroenteritis. She was given IV fluid. Creatinine on presentation was more than 2, but gradually improved to 1.3.  2. Urinary tract infection. She was started on ciprofloxacin. Culture showed it is Enterococcus faecalis sensitive to amoxicillin and nitrofurantoin, change to amoxacillin on discharge.  3. Enteritis and diarrhea. Some complaint of  abdominal pain,on presentation. CT scan confirmed enteritis, but stool culture and Clostridium difficile came to be negative, so most likely it was viral enteritis. She had some improvement and we suggested to give loperamide for that.   4. Hyperkalemia, likely secondary to acute renal failure. Initially was requiring D50 and insulin injection to stabilize, but later on stayed stable after correction of renal function.  5. Hypertension. The patient was hemodynamically stable. We added lisinopril because of renal failure.   IMPORTANT LABORATORY RESULTS IN THE HOSPITAL: On presentation, urinalysis is positive 57 WBCs, 3+ leukocyte esterase.   WBC count 12,000, hemoglobin 11.8, platelet count 373,000 and MCV is 99.   Glucose 107, creatinine 2.65, sodium 145, potassium 6, chloride 112, and CO2 is 20.   Troponin is 0.05. INR is 1.1.   Urine culture is 40,000 CFU for Enterococcus faecalis.   Creatinine came down to 1.96 on the next day. Stool culture remains negative, and on January 23, creatinine came down to 1.39, and Clostridium difficile came out to be negative.   TOTAL TIME SPENT ON THIS DISCHARGE: 35 minutes.    ____________________________ Hope PigeonVaibhavkumar G. Elisabeth PigeonVachhani, MD vgv:JT D: 12/20/2014 12:38:52 ET T: 12/20/2014 13:23:44 ET JOB#: [id number]  cc: Hope PigeonVaibhavkumar G. Elisabeth PigeonVachhani, MD, <Dictator> Altamese DillingVAIBHAVKUMAR Venisa Frampton MD ELECTRONICALLY SIGNED 12/31/2014 21:19

## 2015-03-28 NOTE — Consult Note (Signed)
Chief Complaint:  Subjective/Chief Complaint patient responsive to family today, not to examiner (won't answer questions).  no reported nausea or abdominalpain.   VITAL SIGNS/ANCILLARY NOTES: **Vital Signs.:   23-Feb-16 12:35  Vital Signs Type Q 4hr  Temperature Temperature (F) 99.3  Celsius 37.3  Temperature Source oral  Pulse Pulse 85  Respirations Respirations 18  Systolic BP Systolic BP 659  Diastolic BP (mmHg) Diastolic BP (mmHg) 86  Mean BP 103  Pulse Ox % Pulse Ox % 100  Pulse Ox Activity Level  At rest  Oxygen Delivery Room Air/ 21 %   Brief Assessment:  Cardiac Regular   Respiratory clear BS   Gastrointestinal details normal Soft  Nontender  Nondistended  Bowel sounds normal   Lab Results: Hepatic:  22-Feb-16 04:50   Albumin, Serum  2.3  Routine Chem:  22-Feb-16 04:50   Glucose, Serum 99  BUN  45  Creatinine (comp)  2.33  Sodium, Serum  147  Potassium, Serum 3.7  Chloride, Serum  118  CO2, Serum 21  Calcium (Total), Serum 9.0  Phosphorus, Serum 4.2  Anion Gap 8  Osmolality (calc) 304  eGFR (African American)  26  eGFR (Non-African American)  21 (eGFR values <77m/min/1.73 m2 may be an indication of chronic kidney disease (CKD). Calculated eGFR, using the MRDR Study equation, is useful in  patients with stable renal function. The eGFR calculation will not be reliable in acutely ill patients when serum creatinine is changing rapidly. It is not useful in patients on dialysis. The eGFR calculation may not be applicable to patients at the low and high extremes of body sizes, pregnant women, and vegetarians.)  Routine Hem:  22-Feb-16 04:50   WBC (CBC)  13.1  RBC (CBC)  2.87  Hemoglobin (CBC)  8.5  Hematocrit (CBC)  26.9  Platelet Count (CBC) 290  MCV 94  MCH 29.7  MCHC  31.6  RDW 14.0  Neutrophil % 81.0  Lymphocyte % 10.7  Monocyte % 7.4  Eosinophil % 0.7  Basophil % 0.2  Neutrophil #  10.6  Lymphocyte # 1.4  Monocyte #  1.0  Eosinophil #  0.1  Basophil # 0.0 (Result(s) reported on 18 Jan 2015 at 05:16AM.)   Assessment/Plan:  Assessment/Plan:  Assessment 1) campylobacter enteritis-on abx 2) partial sbo 3) CKD/ARF 4) AMS   Plan 1) continue azithromycin 2) am films 3) cbc am.   Electronic Signatures: SLoistine Simas(MD)  (Signed 23-Feb-16 15:20)  Authored: Chief Complaint, VITAL SIGNS/ANCILLARY NOTES, Brief Assessment, Lab Results, Assessment/Plan   Last Updated: 23-Feb-16 15:20 by SLoistine Simas(MD)

## 2015-03-28 NOTE — Consult Note (Signed)
Chief Complaint:  Subjective/Chief Complaint seen for diarrhea and abdominal pain.   responds to questions simply, some nausea, denies abdominalpain.   VITAL SIGNS/ANCILLARY NOTES: **Vital Signs.:   19-Feb-16 16:00  Vital Signs Type Q 4hr  Temperature Temperature (F) 98  Celsius 36.6  Temperature Source oral  Respirations Respirations 18  Systolic BP Systolic BP 465  Diastolic BP (mmHg) Diastolic BP (mmHg) 87  Mean BP 103  Pulse Ox % Pulse Ox % 98  Pulse Ox Activity Level  At rest  Oxygen Delivery Room Air/ 21 %  *Intake and Output.:   19-Feb-16 00:36  Stool  400    03:35  Stool  50   Brief Assessment:  Cardiac Regular   Respiratory clear BS   Gastrointestinal details normal Soft  Nontender  Nondistended  No masses palpable  bowel sounds positive   Lab Results: Routine Micro:  17-Feb-16 19:55   Micro Text Report STOOL COMPREHENSIVE   ORGANISM 1                MODERATE GROWTH YEAST   COMMENT                   ID TO FOLLOW ONCE ISOLATED   COMMENT                   NO PATHOGENIC E.COLI DETECTED   COMMENT                   POSITIVE CAMPYLOBACTER AG (JEJUNI/COLI)   ANTIBIOTIC                       Culture Comment    . POSITIVE CAMPYLOBACTER AG (JEJUNI/COLI)  Routine Chem:  19-Feb-16 05:39   Magnesium, Serum  1.5 (1.8-2.4 THERAPEUTIC RANGE: 4-7 mg/dL TOXIC: > 10 mg/dL  -----------------------)  Glucose, Serum  149  BUN  45  Creatinine (comp)  3.08  Sodium, Serum  134  Potassium, Serum  3.0  Chloride, Serum  97  CO2, Serum 27  Calcium (Total), Serum 8.8  Anion Gap 10  Osmolality (calc) 283  eGFR (African American)  19  eGFR (Non-African American)  16 (eGFR values <36m/min/1.73 m2 may be an indication of chronic kidney disease (CKD). Calculated eGFR, using the MRDR Study equation, is useful in  patients with stable renal function. The eGFR calculation will not be reliable in acutely ill patients when serum creatinine is changing rapidly. It is not useful  in patients on dialysis. The eGFR calculation may not be applicable to patients at the low and high extremes of body sizes, pregnant women, and vegetarians.)  Result Comment LABS - This specimen was collected through an   - indwelling catheter or arterial line.  - A minimum of 586m of blood was wasted prior    - to collecting the sample.  Interpret  - results with caution.  Result(s) reported on 15 Jan 2015 at 06:58AM.   Assessment/Plan:  Assessment/Plan:  Assessment 1) sepsis/atn/ckd 2) abdominalpain-improving 3) diarrhea-campylobacter positive, now on azithromycin.  may need 2 weeks. Patietn had an abnormal CT with possible narrowing in the distal ileum.  Will recheck films in am.  Diarrhea still occuring, though frequency seems to be improving.   Plan 1) as above, Dr OhCandace Cruiseill round this weekend.   Electronic Signatures: SkLoistine SimasMD)  (Signed 19-Feb-16 18:00)  Authored: Chief Complaint, VITAL SIGNS/ANCILLARY NOTES, Brief Assessment, Lab Results, Assessment/Plan   Last Updated: 19-Feb-16 18:00 by SkLoistine Simas  MD) 

## 2015-03-28 NOTE — Consult Note (Signed)
Brief Consult Note: Diagnosis: Campylobacer enteritis.   Patient was seen by consultant.   Comments: I have reviewed the XRay findings and nursing notes, and there is nothing objective to suggest either a SBO (patient has had > 2000 ml of diarrhea in last 24 hours) or any other surgical problem. Sorry we cannot be of further assistance in this patient. Will sign off.  Electronic Signatures: Claude MangesMarterre, Meloni Hinz F (MD)  (Signed [id number]-Feb-16 17:07)  Authored: Brief Consult Note   Last Updated: 24-Feb-16 17:07 by Claude MangesMarterre, Kamal Jurgens F (MD)

## 2015-03-28 NOTE — Consult Note (Signed)
Chief Complaint:  Subjective/Chief Complaint minimal response to examiner.  continues with diarrhea   VITAL SIGNS/ANCILLARY NOTES: **Vital Signs.:   22-Feb-16 16:10  Vital Signs Type Q 4hr  Temperature Temperature (F) 98.8  Celsius 37.1  Temperature Source oral  Pulse Pulse 81  Respirations Respirations 18  Systolic BP Systolic BP 517  Diastolic BP (mmHg) Diastolic BP (mmHg) 67  Mean BP 79  Pulse Ox % Pulse Ox % 96  Pulse Ox Activity Level  At rest  Oxygen Delivery Room Air/ 21 %  *Intake and Output.:   22-Feb-16 01:06  Other Output ml     Out:  500    Shift 07:00  Other Output ml     Out:  500    Daily 07:00  Other Output ml     Out:  2150    08:24  Other Output ml     Out:  450    Shift 15:00  Other Output ml     Out:  450    17:29  Other Output ml     Out:  200    Shift 23:00  Other Output ml     Out:  200   Brief Assessment:  Cardiac Regular   Respiratory clear BS   Gastrointestinal details normal Soft  Nondistended  Bowel sounds normal  difficult to ascertain from patient reaction whether there is discomfort   Lab Results:  Hepatic:  22-Feb-16 04:50   Albumin, Serum  2.3  Routine Chem:  22-Feb-16 04:50   Glucose, Serum 99  BUN  45  Creatinine (comp)  2.33  Sodium, Serum  147  Potassium, Serum 3.7  Chloride, Serum  118  CO2, Serum 21  Calcium (Total), Serum 9.0  Phosphorus, Serum 4.2  Anion Gap 8  Osmolality (calc) 304  eGFR (African American)  26  eGFR (Non-African American)  21 (eGFR values <13m/min/1.73 m2 may be an indication of chronic kidney disease (CKD). Calculated eGFR, using the MRDR Study equation, is useful in  patients with stable renal function. The eGFR calculation will not be reliable in acutely ill patients when serum creatinine is changing rapidly. It is not useful in patients on dialysis. The eGFR calculation may not be applicable to patients at the low and high extremes of body sizes, pregnant women, and  vegetarians.)  Routine Hem:  21-Feb-16 08:54   WBC (CBC)  11.1  22-Feb-16 04:50   WBC (CBC)  13.1  RBC (CBC)  2.87  Hemoglobin (CBC)  8.5  Hematocrit (CBC)  26.9  Platelet Count (CBC) 290  MCV 94  MCH 29.7  MCHC  31.6  RDW 14.0  Neutrophil % 81.0  Lymphocyte % 10.7  Monocyte % 7.4  Eosinophil % 0.7  Basophil % 0.2  Neutrophil #  10.6  Lymphocyte # 1.4  Monocyte #  1.0  Eosinophil # 0.1  Basophil # 0.0 (Result(s) reported on 18 Jan 2015 at 05:16AM.)   Radiology Results: XRay:    22-Feb-16 07:32, Abdomen 3 Way Includes PA Chest  Abdomen 3 Way Includes PA Chest   REASON FOR EXAM:    hx of pSBO  COMMENTS:       PROCEDURE: DXR - DXR ABDOMEN 3-WAY (INCL PA CXR)  - Jan 18 2015  7:32AM     CLINICAL DATA:  Small bowel obstruction, septic shock, acute renal  failure, altered mental status    EXAM:  ABDOMEN SERIES    COMPARISON:  01/13/2015 abdominal radiographs, 01/10/2015 chest  radiograph  FINDINGS:  LEFT jugular central venous catheter with tip projecting over  cavoatrial junction.    Normal heart size for technique.    Significant atherosclerotic calcifications.    Pulmonary vascularity normal.    Old granulomatous disease changes of the LEFT lung.    Underlying emphysematous changes without infiltrate, pleural  effusion or pneumothorax.    RIGHT nipple shadow.  Gaseous distention of stomach.    Air-filled loops of small bowel consistent with small bowel  obstruction similar to previous exam.    No definite bowel wall thickening or free intraperitoneal air.    Bones markedly demineralized.     IMPRESSION:  Persistent small bowel obstruction with gaseous distention of the  stomach.    The emphysematous no granulomatous disease changes.  Electronically Signed    By: Lavonia Dana M.D.    On: 01/18/2015 08:11         Verified By: Burnetta Sabin, M.D.,   Assessment/Plan:  Assessment/Plan:  Assessment 1) diarrheal illness-stool positive for  campylobacter.  Patient had a 3 day course of azithromycin for this.   This is likely not adequate for the clinical sitruation and she may need up to 2 weeks of treatment. will restart azithromycin. 2) sepsis-wbc improved 8)VSY5,GCY 4)metabolic encephalopathy   Plan as above   Electronic Signatures for Addendum Section:  Loistine Simas (MD) (Signed Addendum [phone] 20:09)  abdominal films reviewed.  worsening of appearnace of bowel distension.  Would recommend repeat CT ab/pelvis. will discuss with [facility] in am.   Electronic Signatures: Loistine Simas (MD)  (Signed [phone] 19:43)  Authored: Chief Complaint, VITAL SIGNS/ANCILLARY NOTES, Brief Assessment, Lab Results, Radiology Results, Assessment/Plan   Last Updated: 22-Feb-16 20:09 by Loistine Simas (MD)

## 2015-03-28 NOTE — H&P (Signed)
PATIENT NAME:  Melissa Roberts, Melissa Roberts MR#:  [id number] DATE OF BIRTH:  Jun 13, 1934  DATE OF ADMISSION:  12/17/2014  PRIMARY CARE PHYSICIAN: Corky DownsJaved Masoud, MD    CHIEF COMPLAINT: Abdominal pain.   HISTORY OF PRESENT ILLNESS: This is a 81 year old female who [facility] due to abdominal pain and some diarrhea. The patient herself has severe Alzheimer dementia; therefore, most of the history obtained from the family at bedside. As per the family, the patient apparently has been complaining of her abdomen hurting now for the past couple of days. She has also been having on and off diarrhea now for the past couple of days. The patient now presents to the hospital as her symptoms are not improving. She was noted to be in acute renal failure and noted to be mildly hyperkalemic. Her CT scan of the abdomen and pelvis is consistent with enteritis versus ileus. Hospitalist services were contacted for further treatment and evaluation.   REVIEW OF SYSTEMS: CONSTITUTIONAL: No documented fever. No weight gain or weight loss.  EYES: No blurry or double vision.  EARS, NOSE, AND THROAT: No tinnitus or postnasal drip. No redness of the oropharynx.  RESPIRATORY: No cough. No wheeze. No hemoptysis. No dyspnea.  CARDIOVASCULAR: No chest pain. No orthopnea. No palpitations. No syncope.  GASTROINTESTINAL: No nausea. No vomiting. Positive diarrhea. Positive abdominal pain. No melena or hematochezia.  GENITOURINARY: No dysuria or hematuria.  ENDOCRINE: No polyuria or nocturia, heat or cold intolerance.  HEMATOLOGIC: No anemia. No bruising. No bleeding.  INTEGUMENTARY: No rashes. No lesions.  MUSCULOSKELETAL: No arthritis or swelling. No gout.  NEUROLOGIC: No numbness or tingling. No ataxia. No seizure-type activity.  PSYCHIATRIC: No anxiety. No insomnia. No ADD. Positive dementia.   PAST MEDICAL HISTORY: Consistent with severe Alzheimer dementia, hypertension, chronic anemia,  chronic kidney disease stage 3, history of diarrhea.   ALLERGIES: CODEINE.   SOCIAL HISTORY: No smoking. No alcohol abuse. No illicit drug abuse. [facility].   FAMILY HISTORY: The patient's mother and father both died from old age.   CURRENT MEDICATIONS: As follows: Cetirizine 5 mg daily, Drisdol 50,000 international units monthly, lisinopril 20 mg daily, loperamide 2 mg daily as needed, lorazepam 0.5 mg b.i.d., and Tylenol 500 mg 2 tablets q. 8 hours as needed.   PHYSICAL EXAMINATION: Presently is as follows:  VITAL SIGNS: Noted to be: Temperature is 98.2, pulse 82, respirations 20, blood pressure 97/69, saturation is 100% on room air.  GENERAL: She is a pleasant-appearing female, but in no apparent distress.  HEENT: Atraumatic, normocephalic. Extraocular muscles are intact. Pupils equal and reactive to light. Sclerae are anicteric. No conjunctival injection. No pharyngeal erythema.  NECK: Supple. There is no jugular venous distention. No bruits, no lymphadenopathy, no thyromegaly.  HEART: Regular rhythm, tachycardic. No murmurs, no rubs, no clicks.  LUNGS: Clear to auscultation bilaterally. No rales, no rhonchi, no wheezes.  ABDOMEN: Soft, flat, nontender, nondistended. Has good bowel sounds. No hepatosplenomegaly appreciated.  EXTREMITIES: No evidence of any cyanosis, clubbing or peripheral edema. Has +2 pedal and radial pulses bilaterally.  NEUROLOGICAL: The patient is alert, awake, oriented x 1; moves all extremities spontaneously. No other focal motor or sensory deficits appreciated bilaterally.  SKIN: Moist and warm with no rashes appreciated.  LYMPHATIC: There is no cervical or axillary lymphadenopathy.   LABORATORY DATA: Showed a serum glucose of 107, BUN 69, creatinine 2.6, sodium 145, potassium 6, chloride 112, bicarb  20. The patient's LFTs are within normal limits. Troponin 0.05. White cell count 12, hemoglobin 11.8, hematocrit 38,  platelet count of 373,000. INR is 1.1. Urinalysis does show 3+ leukocyte esterase with 57 white cells.    ASSESSMENT AND PLAN: This is a 81 year old female with history of advanced dementia, chronic anemia, history of diarrhea, hypertension, chronic kidney disease stage 3, who presents to the hospital due to abdominal pain, diarrhea, noted to be in acute renal failure with hyperkalemia.  1.  Acute on chronic renal failure. I suspect this is secondary to dehydration from her gastroenteritis. I will hydrate the patient with IV fluids, follow BUN and creatinine and urine output, hold her lisinopril for now.  2.  Hyperkalemia. This is likely secondary to the acute renal failure. The patient has been given insulin D50 and calcium in the ER. I will repeat her potassium later tonight. Her hyperkalemia should correct as her renal function improves. EKG does not show any evidence of acute ST changes consistent with hyperkalemia,  3.  Enteritis/diarrhea. This is likely the cause of the patient's abdominal pain. The patient has not been on any recent antibiotics. This probably could be viral. CT scan does show enteritis/ileus. We will continue supportive care with clear liquid diet and IV fluids for now. We will check a stool for Clostridium difficile and comprehensive culture. If her Clostridium difficile toxin assay is negative, we will start her on as needed Imodium. 4.  History of dementia with agitation. I will order some Haldol for agitation. Avoid benzodiazepines.  5.  Hypertension. The patient is hemodynamically stable. Hold her lisinopril given the renal failure and hyperkalemia.   CODE STATUS: The patient is a DNI/DNR.   TIME SPENT ON ADMISSION: Fifty minutes.    ____________________________ Rolly Pancake. Cherlynn Kaiser, MD vjs:TT D: 12/17/2014 19:02:58 ET T: 12/17/2014 19:24:50 ET JOB#: [id number]  cc: Rolly Pancake. Cherlynn Kaiser, MD, <Dictator> Houston Siren MD ELECTRONICALLY SIGNED 12/22/2014 11:55

## 2015-03-28 NOTE — Consult Note (Signed)
Chief Complaint:  Subjective/Chief Complaint seen for abdominalpain and diarrhea, continues with watery stool, intermittantly responsive to examiner.   doesnt answer questions reliably.   VITAL SIGNS/ANCILLARY NOTES: **Vital Signs.:   24-Feb-16 15:42  Vital Signs Type Q 4hr  Temperature Temperature (F) 98  Celsius 36.6  Temperature Source oral  Pulse Pulse 75  Respirations Respirations 17  Systolic BP Systolic BP 128  Diastolic BP (mmHg) Diastolic BP (mmHg) 81  Mean BP 98  Pulse Ox % Pulse Ox % 100  Pulse Ox Activity Level  At rest  Oxygen Delivery Room Air/ 21 %   Brief Assessment:  GEN thin   Cardiac Regular   Respiratory clear BS   Gastrointestinal details normal Soft  Nondistended  Bowel sounds normal  No rebound tenderness  mild diffuse discomfort to palpation   Lab Results: Routine Chem:  24-Feb-16 05:56   Glucose, Serum  111  BUN  40  Creatinine (comp)  2.24  Sodium, Serum  154  Potassium, Serum  3.4  Chloride, Serum  127  CO2, Serum 21  Calcium (Total), Serum 9.2  Anion Gap  6  Osmolality (calc) 316  eGFR (African American)  27  eGFR (Non-African American)  22 (eGFR values <22m/min/1.73 m2 may be an indication of chronic kidney disease (CKD). Calculated eGFR, using the MRDR Study equation, is useful in  patients with stable renal function. The eGFR calculation will not be reliable in acutely ill patients when serum creatinine is changing rapidly. It is not useful in patients on dialysis. The eGFR calculation may not be applicable to patients at the low and high extremes of body sizes, pregnant women, and vegetarians.)  Routine Hem:  21-Feb-16 08:54   WBC (CBC)  11.1  22-Feb-16 04:50   WBC (CBC)  13.1  24-Feb-16 05:56   WBC (CBC)  19.3  RBC (CBC)  2.98  Hemoglobin (CBC)  8.8  Hematocrit (CBC)  28.2  Platelet Count (CBC) 375  MCV 95  MCH 29.7  MCHC  31.4  RDW 14.2  Neutrophil % 85.3  Lymphocyte % 9.1  Monocyte % 4.6  Eosinophil % 0.7   Basophil % 0.3  Neutrophil #  16.4  Lymphocyte # 1.8  Monocyte # 0.9  Eosinophil # 0.1  Basophil # 0.1 (Result(s) reported on 20 Jan 2015 at 06:24AM.)   Assessment/Plan:  Assessment/Plan:  Assessment 1) sepsis-admitted with diarrheal illness.  Positive for campylobacter.  Currently on azithromycin.  AF, tolerating po, continues with watery stool.   Plan 1) C diff last checked 2/17, will recheck.  2) continue current.   Appreciate surgical review.   Electronic Signatures: SLoistine Simas(MD)  (Signed [id number])  Authored: Chief Complaint, VITAL SIGNS/ANCILLARY NOTES, Brief Assessment, Lab Results, Assessment/Plan   Last Updated: 24-Feb-16 18:37 by SLoistine Simas(MD)

## 2016-01-08 IMAGING — CT CT ABD-PELV W/O CM
2 of 4 series · 16 of 46 positions shown, 18 images · non-contrast
Comparison: 08/25/2010

CLINICAL DATA: Abdominal pain and diarrhea.

EXAM:
CT ABDOMEN AND PELVIS WITHOUT CONTRAST
TECHNIQUE: Multidetector CT imaging of the abdomen and pelvis was performed
following the standard protocol without IV contrast.

[Series 2: routine abd pel without · axial · non-contrast · 0.67mm/px · z∈[-1109,-734]mm · 13 of 83 slices shown, 15 images]
[im 4/83  soft-tissue]
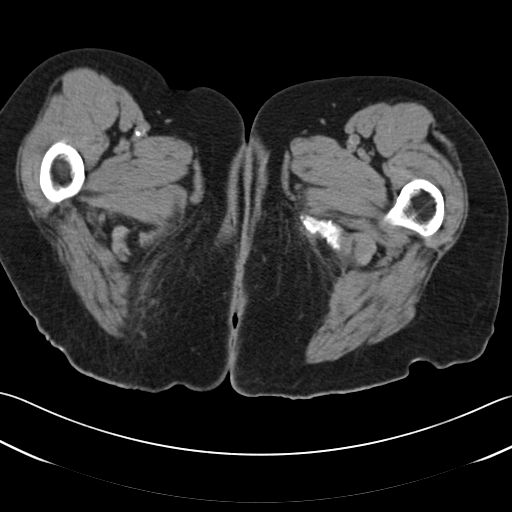
[im 4/83  bone]
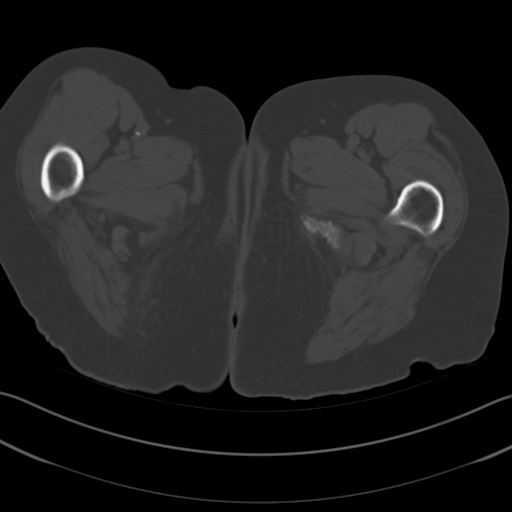
[im 11/83  soft-tissue]
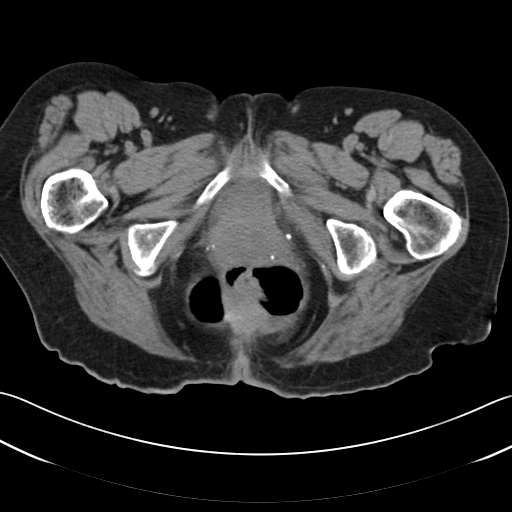
[im 18/83  soft-tissue]
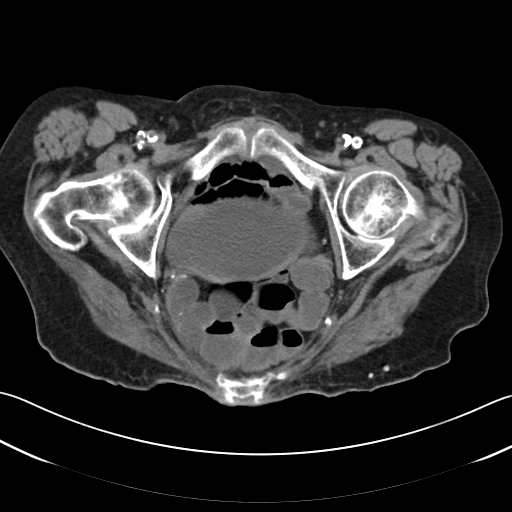
[im 24/83  soft-tissue]
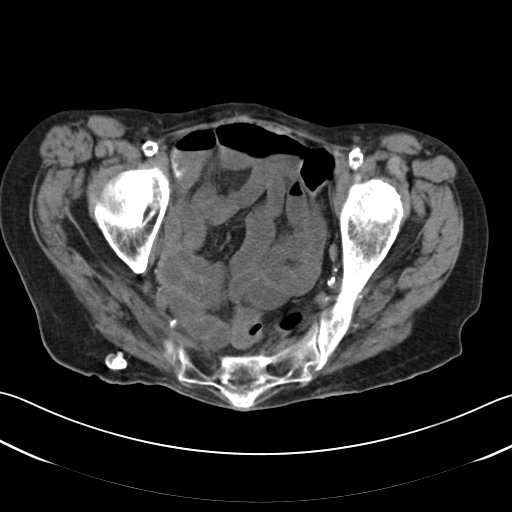
[im 28/83  soft-tissue]
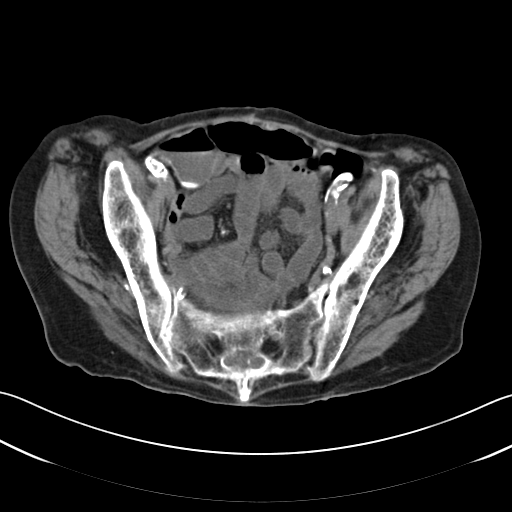
[im 35/83  soft-tissue]
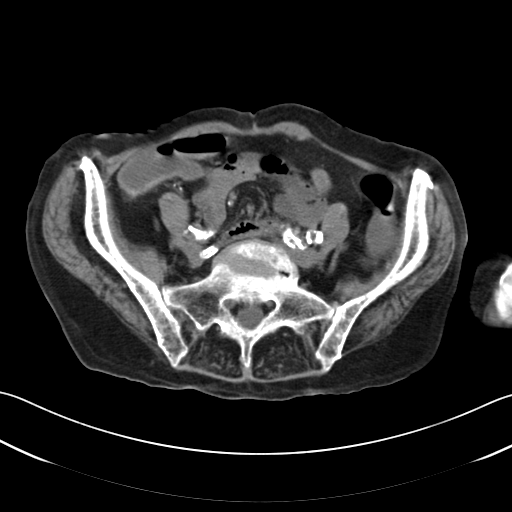
[im 42/83  soft-tissue]
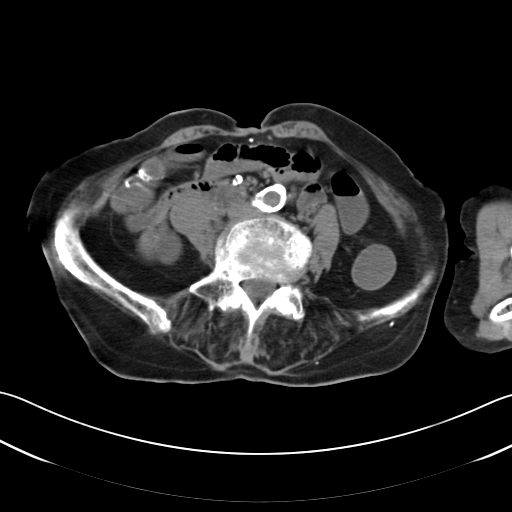
[im 48/83  soft-tissue]
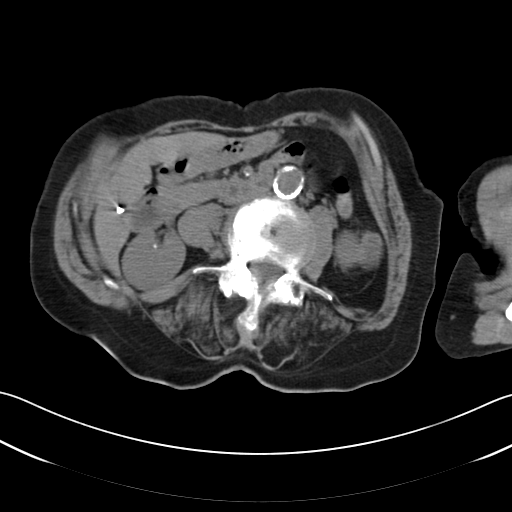
[im 55/83  soft-tissue]
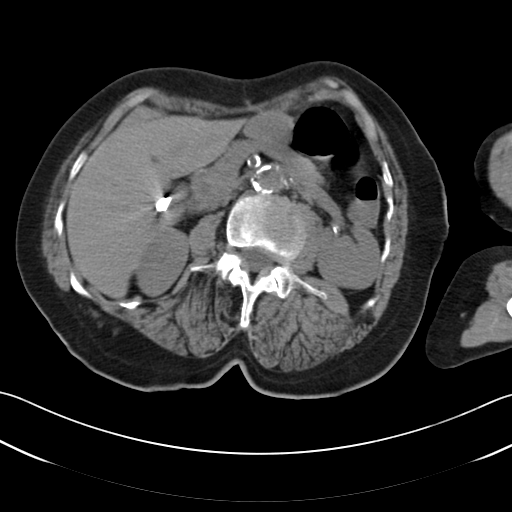
[im 55/83  bone]
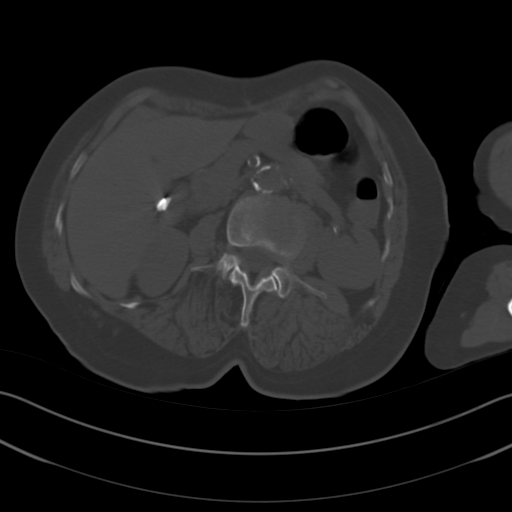
[im 59/83  soft-tissue]
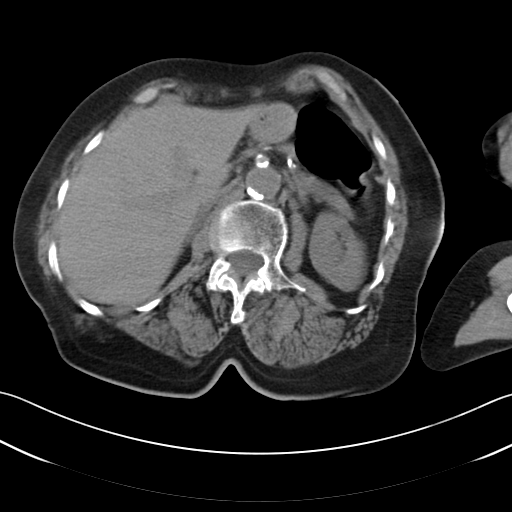
[im 65/83  soft-tissue]
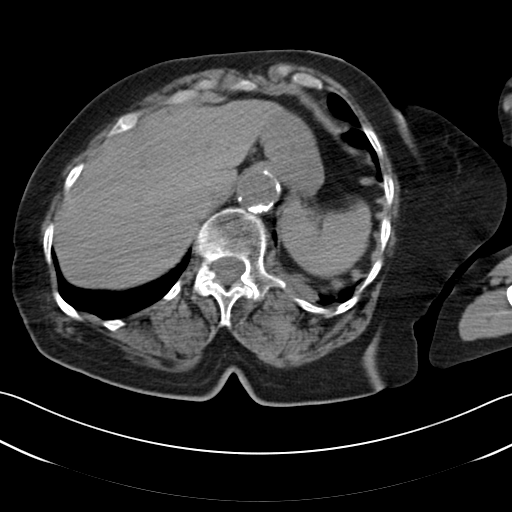
[im 72/83  soft-tissue]
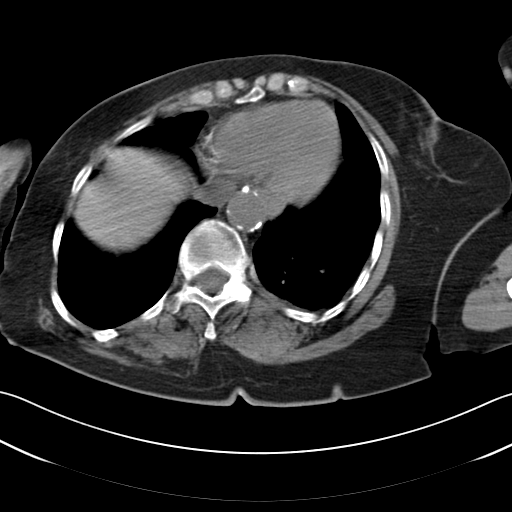
[im 79/83  soft-tissue]
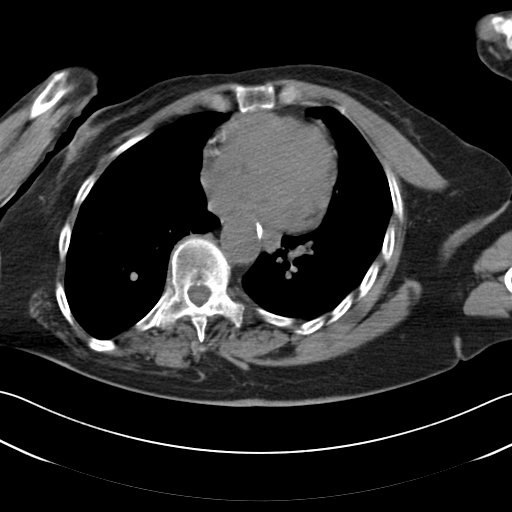

[Series 5: cor routine abd pel wo · coronal · 0.64mm/px · 3 of 125 slices shown]
[im 42/125  soft-tissue]
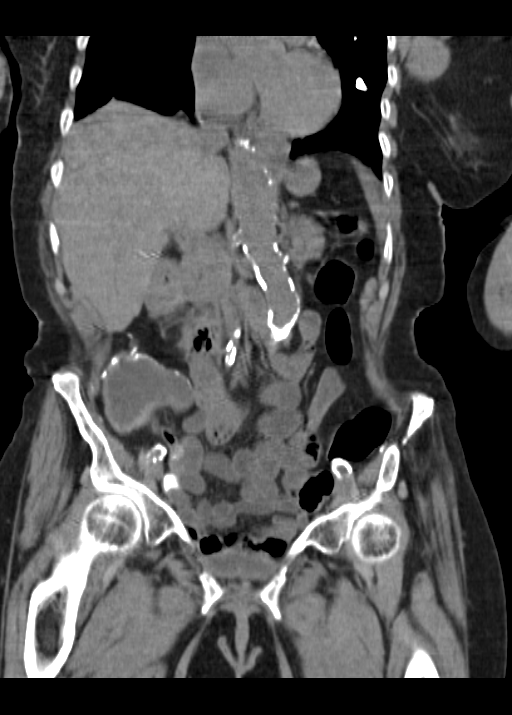
[im 56/125  soft-tissue]
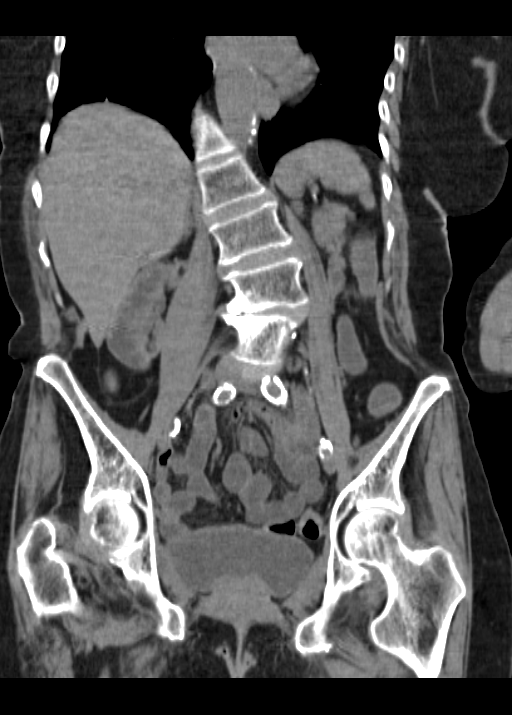
[im 69/125  soft-tissue]
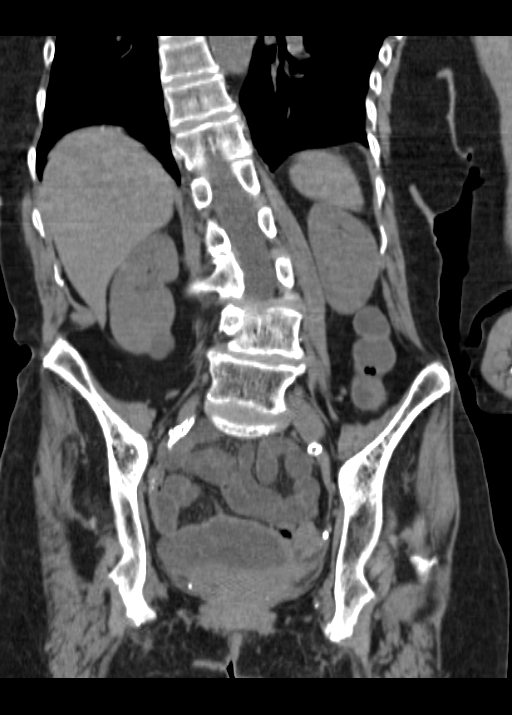

[16 of 46 positions shown; findings below may reference images not displayed]

FINDINGS: Calcified granulomata are present in the lower left lung. The
gallbladder has been removed. No biliary ductal dilatation is seen.
Unenhanced appearance of the liver, spleen, pancreas and adrenal
glands are unremarkable. There are some tiny lower pole
nonobstructing calculi in the right kidney. Stable inferior right
renal cyst has a benign appearance.

Bowel shows no evidence of obstruction. There is fluid throughout
multiple nondilated small bowel loops which also contain air.
Findings may be consistent with enteritis/ileus. No free air. There
also is some fluid in the colon. The bladder is unremarkable. No
masses or enlarged lymph nodes are seen. The aorta is heavily
calcified without evidence of aneurysmal disease. No abnormal fluid
collections. The spine shows prominent leftward convex scoliosis.
There is degenerative disc disease at L3-4.
IMPRESSION: Fluid in multiple small bowel loops as well as the colon. Findings
suggest enteritis/ileus. No evidence of bowel perforation or
abscess. Tiny nonobstructing calculi in the lower pole of the right
kidney.
